# Patient Record
Sex: Female | Born: 1990 | Race: Black or African American | Hispanic: No | Marital: Single | Smoking: Never smoker
Health system: Southern US, Community
[De-identification: ages and names within clinical notes are randomized; demographics above are authoritative.]

## PROBLEM LIST (undated history)

## (undated) DIAGNOSIS — Z789 Other specified health status: Secondary | ICD-10-CM

## (undated) HISTORY — PX: NO PAST SURGERIES: SHX2092

---

## 2016-07-17 ENCOUNTER — Encounter: Payer: Self-pay | Admitting: Emergency Medicine

## 2016-07-17 ENCOUNTER — Emergency Department: Payer: Medicaid Other

## 2016-07-17 ENCOUNTER — Emergency Department
Admission: EM | Admit: 2016-07-17 | Discharge: 2016-07-17 | Disposition: A | Discharge status: Home / Self Care | Payer: Medicaid Other | Attending: Emergency Medicine | Admitting: Emergency Medicine

## 2016-07-17 DIAGNOSIS — R102 Pelvic and perineal pain: Secondary | ICD-10-CM | POA: Diagnosis present

## 2016-07-17 DIAGNOSIS — N809 Endometriosis, unspecified: Secondary | ICD-10-CM | POA: Insufficient documentation

## 2016-07-17 DIAGNOSIS — R1030 Lower abdominal pain, unspecified: Secondary | ICD-10-CM

## 2016-07-17 DIAGNOSIS — N80129 Deep endometriosis of ovary, unspecified ovary: Secondary | ICD-10-CM

## 2016-07-17 LAB — CHLAMYDIA/NGC RT PCR (ARMC ONLY)
Chlamydia Tr: NOT DETECTED
N GONORRHOEAE: NOT DETECTED

## 2016-07-17 LAB — COMPREHENSIVE METABOLIC PANEL
ALBUMIN: 4.3 g/dL (ref 3.5–5.0)
ALT: 8 U/L — ABNORMAL LOW (ref 14–54)
ANION GAP: 10 (ref 5–15)
AST: 15 U/L (ref 15–41)
Alkaline Phosphatase: 39 U/L (ref 38–126)
CO2: 27 mmol/L (ref 22–32)
Calcium: 9.5 mg/dL (ref 8.9–10.3)
Chloride: 101 mmol/L (ref 101–111)
Creatinine, Ser: 0.41 mg/dL — ABNORMAL LOW (ref 0.44–1.00)
GFR calc Af Amer: >60 mL/min (ref >60)
GFR calc non Af Amer: >60 mL/min (ref >60)
GLUCOSE: 85 mg/dL (ref 65–99)
POTASSIUM: 3 mmol/L — AB (ref 3.5–5.1)
SODIUM: 138 mmol/L (ref 135–145)
Total Bilirubin: 0.4 mg/dL (ref 0.3–1.2)
Total Protein: 8.1 g/dL (ref 6.5–8.1)

## 2016-07-17 LAB — URINALYSIS, COMPLETE (UACMP) WITH MICROSCOPIC
BACTERIA UA: NONE SEEN
Bilirubin Urine: NEGATIVE
Glucose, UA: NEGATIVE mg/dL
Hgb urine dipstick: NEGATIVE
Ketones, ur: 20 mg/dL — AB
NITRITE: NEGATIVE
PROTEIN: NEGATIVE mg/dL
Specific Gravity, Urine: 1.013 (ref 1.005–1.030)
pH: 6 (ref 5.0–8.0)

## 2016-07-17 LAB — WET PREP, GENITAL
Clue Cells Wet Prep HPF POC: NONE SEEN
Sperm: NONE SEEN
Trich, Wet Prep: NONE SEEN
Yeast Wet Prep HPF POC: NONE SEEN

## 2016-07-17 LAB — CBC
HEMATOCRIT: 33.2 % — AB (ref 35.0–47.0)
HEMOGLOBIN: 11.2 g/dL — AB (ref 12.0–16.0)
MCH: 27.5 pg (ref 26.0–34.0)
MCHC: 33.6 g/dL (ref 32.0–36.0)
MCV: 81.7 fL (ref 80.0–100.0)
Platelets: 426 10*3/uL (ref 150–440)
RBC: 4.06 MIL/uL (ref 3.80–5.20)
RDW: 13.5 % (ref 11.5–14.5)
WBC: 4.8 10*3/uL (ref 3.6–11.0)

## 2016-07-17 LAB — POCT PREGNANCY, URINE: PREG TEST UR: NEGATIVE

## 2016-07-17 LAB — LIPASE, BLOOD: Lipase: 10 U/L — ABNORMAL LOW (ref 11–51)

## 2016-07-17 MED ORDER — FENTANYL CITRATE (PF) 100 MCG/2ML IJ SOLN
50.0000 ug | Freq: Once | INTRAMUSCULAR | Status: AC
Start: 2016-07-17 — End: 2016-07-17
  Administered 2016-07-17: 50 ug via INTRAVENOUS
  Filled 2016-07-17: qty 1

## 2016-07-17 MED ORDER — IBUPROFEN 200 MG PO TABS: 600.0000 mg | ORAL_TABLET | Freq: Four times a day (QID) | ORAL | 0 refills | Status: AC | PRN

## 2016-07-17 MED ORDER — FENTANYL CITRATE (PF) 100 MCG/2ML IJ SOLN
INTRAMUSCULAR | Status: AC
  Filled 2016-07-17: qty 2

## 2016-07-17 MED ORDER — MORPHINE SULFATE (PF) 4 MG/ML IV SOLN: 4.0000 mg | Freq: Once | INTRAVENOUS | Status: DC

## 2016-07-17 MED ORDER — KETOROLAC TROMETHAMINE 30 MG/ML IJ SOLN
30.0000 mg | Freq: Once | INTRAMUSCULAR | Status: AC
  Administered 2016-07-17: 30 mg via INTRAVENOUS
  Filled 2016-07-17: qty 1

## 2016-07-17 MED ORDER — SODIUM CHLORIDE 0.9 % IV BOLUS (SEPSIS)
1000.0000 mL | Freq: Once | INTRAVENOUS | Status: AC
  Administered 2016-07-17: 1000 mL via INTRAVENOUS

## 2016-07-17 NOTE — ED Triage Notes (Signed)
Pt reports LLQ abdominal pain that began yesterday. Pt denies NVD. Denies fever. Pt denies dysuria.

## 2016-07-17 NOTE — ED Provider Notes (Addendum)
[facility] Emergency Department Provider Note  ____________________________________________   I have reviewed the triage vital signs and the nursing notes.   HISTORY  Chief Complaint Abdominal Pain    HPI Cassandra Crawford is a 26 y.o. female who presents complaining today of lower pelvic discomfort. Patient apparently has the same pelvic pain every month shortly after her menstrual period, it waxes and wanes in intensity. Usually eats a few days after the end of her menstrual period. Her normal period ended about 5 days ago she thanks. Today she had a gradual onset of the same cramping lower pelvic pain that she always has. She has not followed up with OB for this in the past because she states that she just got her Medicare sorted out. She denies any vomiting or fever she denies any numbness or weakness she denies any vaginal discharge or STI exposure she denies any change in bowel or bladder, nothing makes the pain worst of it makes the pain better and is cramping some purpura discomfort.    History reviewed. No pertinent past medical history.  There are no active problems to display for this patient.   No past surgical history on file.  Prior to Admission medications   Not on File    Allergies Patient has no known allergies.  No family history on file.  Social History Social History  Substance Use Topics  . Smoking status: Not on file  . Smokeless tobacco: Not on file  . Alcohol use Not on file    Review of Systems Constitutional: No fever/chills Eyes: No visual changes. ENT: No sore throat. No stiff neck no neck pain Cardiovascular: Denies chest pain. Respiratory: Denies shortness of breath. Gastrointestinal:   no vomiting.  No diarrhea.  No constipation. Genitourinary: Negative for dysuria. Musculoskeletal: Negative lower extremity swelling Skin: Negative for rash. Neurological: Negative for severe headaches, focal weakness or  numbness. 10-point ROS otherwise negative.  ____________________________________________   PHYSICAL EXAM:  VITAL SIGNS: ED Triage Vitals [07/17/16 1553]  Enc Vitals Group     BP 111/89     Pulse Rate 97     Resp 18     Temp 98.6 F (37 C)     Temp Source Oral     SpO2 96 %     Weight 115 lb (52.2 kg)     Height 5' (1.524 m)     Head Circumference      Peak Flow      Pain Score 10     Pain Loc      Pain Edu?      Excl. in GC?     Constitutional: Alert and oriented. Well appearing and in Nontoxic but uncomfortable Eyes: Conjunctivae are normal. PERRL. EOMI. Head: Atraumatic. Nose: No congestion/rhinnorhea. Mouth/Throat: Mucous membranes are moist.  Oropharynx non-erythematous. Neck: No stridor.   Nontender with no meningismus Cardiovascular: Normal rate, regular rhythm. Grossly normal heart sounds.  Good peripheral circulation. Respiratory: Normal respiratory effort.  No retractions. Lungs CTAB. Abdominal: Soft and nontender. No distention. No guarding no rebound Back:  There is no focal tenderness or step off.  there is no midline tenderness there are no lesions noted. there is no CVA tenderness Pelvic exam: Female nurse chaperone present, no external lesions noted, physiologic vaginal discharge noted with no purulent discharge, no cervical motion tenderness, slight left adnexal tenderness and no mass, there is no significant uterine tenderness or mass. No vaginal bleeding Musculoskeletal: No lower extremity tenderness, no upper extremity tenderness.  No joint effusions, no DVT signs strong distal pulses no edema Neurologic:  Normal speech and language. No gross focal neurologic deficits are appreciated.  Skin:  Skin is warm, dry and intact. No rash noted. Psychiatric: Mood and affect are normal. Speech and behavior are normal.  ____________________________________________   LABS (all labs ordered are listed, but only abnormal results are displayed)  Labs Reviewed   LIPASE, BLOOD - Abnormal; Notable for the following:       Result Value   Lipase 10 (*)    All other components within normal limits  COMPREHENSIVE METABOLIC PANEL - Abnormal; Notable for the following:    Potassium 3.0 (*)    BUN <5 (*)    Creatinine, Ser 0.41 (*)    ALT 8 (*)    All other components within normal limits  CBC - Abnormal; Notable for the following:    Hemoglobin 11.2 (*)    HCT 33.2 (*)    All other components within normal limits  URINALYSIS, COMPLETE (UACMP) WITH MICROSCOPIC - Abnormal; Notable for the following:    Color, Urine YELLOW (*)    APPearance CLEAR (*)    Ketones, ur 20 (*)    Leukocytes, UA TRACE (*)    Squamous Epithelial / LPF 0-5 (*)    All other components within normal limits  CHLAMYDIA/NGC RT PCR ([facility] ONLY)  WET PREP, GENITAL  POC URINE PREG, ED  POCT PREGNANCY, URINE   ____________________________________________  EKG  I personally interpreted any EKGs ordered by me or triage  ____________________________________________  RADIOLOGY  I reviewed any imaging ordered by me or triage that were performed during my shift and, if possible, patient and/or family made aware of any abnormal findings. ____________________________________________   PROCEDURES  Procedure(s) performed: None  Procedures  Critical Care performed: None  ____________________________________________   INITIAL IMPRESSION / ASSESSMENT AND PLAN / ED COURSE  Pertinent labs & imaging results that were available during my care of the patient were reviewed by me and considered in my medical decision making (see chart for details).  She was severe recurrent abdominal pain. She is quite uncomfortable when I first saw her, however after pain medications now she is nearly pain-free and much more relaxed. Her workup thus far as reassuring. Patient has had recurrent pelvic discomfort. Could be mittelschmerz or other such cyclic OB/GYN related discomfort.  Ovarian cyst or torsion is possible to given chronic recurrent nature of the pain I have low suspicion for that. Very low suspicion for appendicitis. Patient has a completely benign exam with minimal left adnexal discomfort. We will obtain a ultrasound. There is no evidence on exam of TOA or PID. If she remains well-appearing with a reassuring exam and she'll be safe for outpatient OB follow-up for this chronic recurrent symptom.  ----------------------------------------- 10:19 PM on 07/17/2016 -----------------------------------------  Discussed with Dr. Chauncey CruelStabler, patient has likely endometriomas, we have no evidence of torsion. He did look at the ultrasound agrees with management and discharge will follow closely as an outpatient patient is pain-free at this time. Extensive return precautions and follow-up instructions given. Patient was sent she must follow-up because we are not 100% sure that these are endometriomas and she needs to be fully evaluated to ensure that it is not oncology process  Clinical Course    ____________________________________________   FINAL CLINICAL IMPRESSION(S) / ED DIAGNOSES  Final diagnoses:  Pelvic pain      This chart was dictated using voice recognition software.  Despite best efforts to proofread,  errors can occur which can change meaning.      Jeanmarie Plant, MD 07/17/16 2956    Jeanmarie Plant, MD 07/17/16 2219

## 2016-07-30 ENCOUNTER — Encounter
Admission: RE | Admit: 2016-07-30 | Discharge: 2016-07-30 | Disposition: A | Discharge status: Home / Self Care | Payer: Medicaid Other | Source: Ambulatory Visit | Attending: Obstetrics & Gynecology | Admitting: Obstetrics & Gynecology

## 2016-07-30 NOTE — Patient Instructions (Signed)
  Your procedure is scheduled on: August 05, 2016 (Tuesday) Report to Same Day Surgery 2nd floor medical mall [facility](Medical Mall Entrance-take elevator on left to 2nd floor.  Check in with surgery information desk.) To find out your arrival time please call [phone] between 1PM - 3PM on August 04, 2016 (Monday)  Remember: Instructions that are not followed completely may result in serious medical risk, up to and including death, or upon the discretion of your surgeon and anesthesiologist your surgery may need to be rescheduled.    _x___ 1. Do not eat food or drink liquids after midnight. No gum chewing or hard candies.     __x__ 2. No Alcohol for 24 hours before or after surgery.   __x__3. No Smoking for 24 prior to surgery.   ____  4. Bring all medications with you on the day of surgery if instructed.    __x__ 5. Notify your doctor if there is any change in your medical condition     (cold, fever, infections).     Do not wear jewelry, make-up, hairpins, clips or nail polish.  Do not wear lotions, powders, or perfumes. You may wear deodorant.  Do not shave 48 hours prior to surgery. Men may shave face and neck.  Do not bring valuables to the hospital.    [facility] is not responsible for any belongings or valuables.               Contacts, dentures or bridgework may not be worn into surgery.  Leave your suitcase in the car. After surgery it may be brought to your room.  For patients admitted to the hospital, discharge time is determined by your treatment team.   Patients discharged the day of surgery will not be allowed to drive home.  You will need someone to drive you home and stay with you the night of your procedure.    Please read over the following fact sheets that you were given:   [facility] Preparing for Surgery and or MRSA Information   __ Take these medicines the morning of surgery with A SIP OF WATER:    1.   2.  3.  4.  5.  6.  ____Fleets enema or Magnesium  Citrate as directed.   _x___ Use CHG Soap or sage wipes as directed on instruction sheet   ____ Use inhalers on the day of surgery and bring to hospital day of surgery  ____ Stop metformin 2 days prior to surgery    ____ Take 1/2 of usual insulin dose the night before surgery and none on the morning of           surgery.   __x__ Stop Aspirin, Coumadin, Pllavix ,Eliquis, Effient, or Pradaxa (NO ASPIRIN)  x__ Stop Anti-inflammatories such as Advil, Aleve, Ibuprofen, Motrin, Naproxen,          Naprosyn, Goodies powders or aspirin products. Ok to take Tylenol or pain medication.   ____ Stop supplements until after surgery.    ____ Bring C-Pap to the hospital.

## 2016-08-04 ENCOUNTER — Encounter
Admission: RE | Admit: 2016-08-04 | Discharge: 2016-08-04 | Disposition: A | Discharge status: Home / Self Care | Payer: Medicaid Other | Source: Ambulatory Visit | Attending: Obstetrics & Gynecology | Admitting: Obstetrics & Gynecology

## 2016-08-04 DIAGNOSIS — Z01812 Encounter for preprocedural laboratory examination: Secondary | ICD-10-CM | POA: Diagnosis present

## 2016-08-04 LAB — TYPE AND SCREEN
ABO/RH(D): A POS
ANTIBODY SCREEN: NEGATIVE

## 2016-08-04 LAB — CBC
HCT: 35.1 % (ref 35.0–47.0)
HEMOGLOBIN: 11.8 g/dL — AB (ref 12.0–16.0)
MCH: 27.5 pg (ref 26.0–34.0)
MCHC: 33.6 g/dL (ref 32.0–36.0)
MCV: 81.8 fL (ref 80.0–100.0)
PLATELETS: 343 10*3/uL (ref 150–440)
RBC: 4.29 MIL/uL (ref 3.80–5.20)
RDW: 13.8 % (ref 11.5–14.5)
WBC: 5.6 10*3/uL (ref 3.6–11.0)

## 2016-08-05 ENCOUNTER — Encounter
Admission: RE | Disposition: A | Discharge status: Home / Self Care | Payer: Self-pay | Source: Ambulatory Visit | Attending: Obstetrics & Gynecology

## 2016-08-05 ENCOUNTER — Ambulatory Visit
Admission: RE | Admit: 2016-08-05 | Discharge: 2016-08-05 | Disposition: A | Discharge status: Home / Self Care | Payer: Medicaid Other | Source: Ambulatory Visit | Attending: Obstetrics & Gynecology | Admitting: Obstetrics & Gynecology

## 2016-08-05 ENCOUNTER — Ambulatory Visit: Payer: Medicaid Other | Admitting: Registered Nurse

## 2016-08-05 ENCOUNTER — Encounter: Payer: Self-pay | Admitting: *Deleted

## 2016-08-05 DIAGNOSIS — N83202 Unspecified ovarian cyst, left side: Secondary | ICD-10-CM | POA: Insufficient documentation

## 2016-08-05 DIAGNOSIS — N736 Female pelvic peritoneal adhesions (postinfective): Secondary | ICD-10-CM | POA: Insufficient documentation

## 2016-08-05 DIAGNOSIS — N83201 Unspecified ovarian cyst, right side: Secondary | ICD-10-CM | POA: Diagnosis present

## 2016-08-05 HISTORY — PX: LAPAROSCOPY: SHX197

## 2016-08-05 HISTORY — DX: Other specified health status: Z78.9

## 2016-08-05 HISTORY — PX: LAPAROSCOPIC OVARIAN CYSTECTOMY: SHX6248

## 2016-08-05 LAB — POCT PREGNANCY, URINE: Preg Test, Ur: NEGATIVE

## 2016-08-05 LAB — ABO/RH: ABO/RH(D): A POS

## 2016-08-05 SURGERY — LAPAROSCOPY OPERATIVE
Anesthesia: General | Laterality: Right | Wound class: Clean Contaminated

## 2016-08-05 MED ORDER — ACETAMINOPHEN 650 MG RE SUPP
650.0000 mg | RECTAL | Status: DC | PRN
  Filled 2016-08-05: qty 1

## 2016-08-05 MED ORDER — MIDAZOLAM HCL 2 MG/2ML IJ SOLN
INTRAMUSCULAR | Status: DC | PRN
  Administered 2016-08-05: 2 mg via INTRAVENOUS

## 2016-08-05 MED ORDER — PHENYLEPHRINE HCL 10 MG/ML IJ SOLN
INTRAMUSCULAR | Status: DC | PRN
  Administered 2016-08-05: 100 ug via INTRAVENOUS

## 2016-08-05 MED ORDER — OXYCODONE-ACETAMINOPHEN 5-325 MG PO TABS
ORAL_TABLET | ORAL | Status: AC
  Administered 2016-08-05: 1 via ORAL
  Filled 2016-08-05: qty 1

## 2016-08-05 MED ORDER — LIDOCAINE HCL (PF) 2 % IJ SOLN
INTRAMUSCULAR | Status: AC
  Filled 2016-08-05: qty 2

## 2016-08-05 MED ORDER — FAMOTIDINE 20 MG PO TABS
ORAL_TABLET | ORAL | Status: AC
  Administered 2016-08-05: 20 mg via ORAL
  Filled 2016-08-05: qty 1

## 2016-08-05 MED ORDER — OXYCODONE-ACETAMINOPHEN 5-325 MG PO TABS: 1.0000 | ORAL_TABLET | ORAL | 0 refills | Status: AC | PRN

## 2016-08-05 MED ORDER — DEXAMETHASONE SODIUM PHOSPHATE 10 MG/ML IJ SOLN
INTRAMUSCULAR | Status: DC | PRN
  Administered 2016-08-05: 10 mg via INTRAVENOUS

## 2016-08-05 MED ORDER — KETOROLAC TROMETHAMINE 30 MG/ML IJ SOLN
30.0000 mg | Freq: Four times a day (QID) | INTRAMUSCULAR | Status: DC
  Administered 2016-08-05: 30 mg via INTRAVENOUS
  Filled 2016-08-05: qty 1

## 2016-08-05 MED ORDER — FENTANYL CITRATE (PF) 100 MCG/2ML IJ SOLN
INTRAMUSCULAR | Status: AC
  Filled 2016-08-05: qty 2

## 2016-08-05 MED ORDER — FENTANYL CITRATE (PF) 100 MCG/2ML IJ SOLN
INTRAMUSCULAR | Status: AC
  Administered 2016-08-05: 25 ug via INTRAVENOUS
  Filled 2016-08-05: qty 2

## 2016-08-05 MED ORDER — BUPIVACAINE HCL (PF) 0.5 % IJ SOLN
INTRAMUSCULAR | Status: AC
  Filled 2016-08-05: qty 20

## 2016-08-05 MED ORDER — ONDANSETRON HCL 4 MG/2ML IJ SOLN: 4.0000 mg | Freq: Once | INTRAMUSCULAR | Status: DC | PRN

## 2016-08-05 MED ORDER — PROPOFOL 10 MG/ML IV BOLUS
INTRAVENOUS | Status: DC | PRN
  Administered 2016-08-05: 130 mg via INTRAVENOUS

## 2016-08-05 MED ORDER — MORPHINE SULFATE (PF) 4 MG/ML IV SOLN: 1.0000 mg | INTRAVENOUS | Status: DC | PRN

## 2016-08-05 MED ORDER — MIDAZOLAM HCL 2 MG/2ML IJ SOLN
INTRAMUSCULAR | Status: AC
  Filled 2016-08-05: qty 2

## 2016-08-05 MED ORDER — KETOROLAC TROMETHAMINE 30 MG/ML IJ SOLN
INTRAMUSCULAR | Status: AC
  Administered 2016-08-05: 30 mg via INTRAVENOUS
  Filled 2016-08-05: qty 1

## 2016-08-05 MED ORDER — ACETAMINOPHEN 325 MG PO TABS: 650.0000 mg | ORAL_TABLET | ORAL | Status: DC | PRN

## 2016-08-05 MED ORDER — HYDROMORPHONE HCL 1 MG/ML IJ SOLN
0.2500 mg | INTRAMUSCULAR | Status: DC | PRN
  Administered 2016-08-05 (×4): 0.25 mg via INTRAVENOUS

## 2016-08-05 MED ORDER — ROCURONIUM BROMIDE 100 MG/10ML IV SOLN
INTRAVENOUS | Status: DC | PRN
  Administered 2016-08-05: 40 mg via INTRAVENOUS

## 2016-08-05 MED ORDER — FENTANYL CITRATE (PF) 100 MCG/2ML IJ SOLN
INTRAMUSCULAR | Status: DC | PRN
  Administered 2016-08-05: 100 ug via INTRAVENOUS
  Administered 2016-08-05: 25 ug via INTRAVENOUS

## 2016-08-05 MED ORDER — LIDOCAINE HCL (CARDIAC) 20 MG/ML IV SOLN
INTRAVENOUS | Status: DC | PRN
  Administered 2016-08-05: 80 mg via INTRAVENOUS

## 2016-08-05 MED ORDER — SUGAMMADEX SODIUM 200 MG/2ML IV SOLN
INTRAVENOUS | Status: DC | PRN
  Administered 2016-08-05: 200 mg via INTRAVENOUS

## 2016-08-05 MED ORDER — ONDANSETRON HCL 4 MG/2ML IJ SOLN
INTRAMUSCULAR | Status: AC
  Filled 2016-08-05: qty 2

## 2016-08-05 MED ORDER — FENTANYL CITRATE (PF) 100 MCG/2ML IJ SOLN
25.0000 ug | INTRAMUSCULAR | Status: DC | PRN
  Administered 2016-08-05 (×4): 25 ug via INTRAVENOUS

## 2016-08-05 MED ORDER — BUPIVACAINE HCL (PF) 0.5 % IJ SOLN
INTRAMUSCULAR | Status: DC | PRN
  Administered 2016-08-05: 10 mL

## 2016-08-05 MED ORDER — ROCURONIUM BROMIDE 50 MG/5ML IV SOSY
PREFILLED_SYRINGE | INTRAVENOUS | Status: AC
  Filled 2016-08-05: qty 5

## 2016-08-05 MED ORDER — LACTATED RINGERS IV SOLN
INTRAVENOUS | Status: DC
  Administered 2016-08-05: 13:00:00 via INTRAVENOUS

## 2016-08-05 MED ORDER — DEXAMETHASONE SODIUM PHOSPHATE 10 MG/ML IJ SOLN
INTRAMUSCULAR | Status: AC
  Filled 2016-08-05: qty 1

## 2016-08-05 MED ORDER — HYDROMORPHONE HCL 1 MG/ML IJ SOLN
INTRAMUSCULAR | Status: AC
  Administered 2016-08-05: 0.25 mg via INTRAVENOUS
  Filled 2016-08-05: qty 1

## 2016-08-05 MED ORDER — PROPOFOL 10 MG/ML IV BOLUS
INTRAVENOUS | Status: AC
  Filled 2016-08-05: qty 20

## 2016-08-05 MED ORDER — FAMOTIDINE 20 MG PO TABS
20.0000 mg | ORAL_TABLET | Freq: Once | ORAL | Status: AC
  Administered 2016-08-05: 20 mg via ORAL

## 2016-08-05 MED ORDER — ONDANSETRON HCL 4 MG/2ML IJ SOLN
INTRAMUSCULAR | Status: DC | PRN
  Administered 2016-08-05: 4 mg via INTRAVENOUS

## 2016-08-05 MED ORDER — OXYCODONE-ACETAMINOPHEN 5-325 MG PO TABS
1.0000 | ORAL_TABLET | ORAL | Status: DC | PRN
  Administered 2016-08-05: 1 via ORAL

## 2016-08-05 SURGICAL SUPPLY — 36 items
BLADE SURG SZ11 CARB STEEL (BLADE) ×4 IMPLANT
CANISTER SUCT 1200ML W/VALVE (MISCELLANEOUS) ×4 IMPLANT
CATH ROBINSON RED A/P 16FR (CATHETERS) ×4 IMPLANT
CHLORAPREP W/TINT 26ML (MISCELLANEOUS) ×4 IMPLANT
DEFOGGER SCOPE WARMER CLEARIFY (MISCELLANEOUS) ×8 IMPLANT
DRESSING TELFA 4X3 1S ST N-ADH (GAUZE/BANDAGES/DRESSINGS) IMPLANT
ENDOPOUCH RETRIEVER 10 (MISCELLANEOUS) IMPLANT
GAUZE SPONGE NON-WVN 2X2 STRL (MISCELLANEOUS) IMPLANT
GLOVE BIO SURGEON STRL SZ8 (GLOVE) ×16 IMPLANT
GLOVE INDICATOR 8.0 STRL GRN (GLOVE) ×12 IMPLANT
GOWN STRL REUS W/ TWL LRG LVL3 (GOWN DISPOSABLE) ×4 IMPLANT
GOWN STRL REUS W/ TWL XL LVL3 (GOWN DISPOSABLE) ×2 IMPLANT
GOWN STRL REUS W/TWL LRG LVL3 (GOWN DISPOSABLE) ×4
GOWN STRL REUS W/TWL XL LVL3 (GOWN DISPOSABLE) ×2
IRRIGATION STRYKERFLOW (MISCELLANEOUS) ×2 IMPLANT
IRRIGATOR STRYKERFLOW (MISCELLANEOUS) ×4
IV LACTATED RINGERS 1000ML (IV SOLUTION) IMPLANT
KIT PINK PAD W/HEAD ARE REST (MISCELLANEOUS) ×4
KIT PINK PAD W/HEAD ARM REST (MISCELLANEOUS) ×2 IMPLANT
LABEL OR SOLS (LABEL) IMPLANT
LIQUID BAND (GAUZE/BANDAGES/DRESSINGS) ×4 IMPLANT
NEEDLE VERESS 14GA 120MM (NEEDLE) ×4 IMPLANT
NS IRRIG 500ML POUR BTL (IV SOLUTION) ×4 IMPLANT
PACK GYN LAPAROSCOPIC (MISCELLANEOUS) ×4 IMPLANT
PAD PREP 24X41 OB/GYN DISP (PERSONAL CARE ITEMS) ×4 IMPLANT
SCISSORS METZENBAUM CVD 33 (INSTRUMENTS) IMPLANT
SHEARS HARMONIC ACE PLUS 36CM (ENDOMECHANICALS) ×4 IMPLANT
SLEEVE ENDOPATH XCEL 5M (ENDOMECHANICALS) IMPLANT
SPONGE VERSALON 2X2 STRL (MISCELLANEOUS)
STRAP SAFETY BODY (MISCELLANEOUS) ×4 IMPLANT
SUT VIC AB 2-0 UR6 27 (SUTURE) IMPLANT
SUT VIC AB 4-0 PS2 18 (SUTURE) IMPLANT
SYRINGE 10CC LL (SYRINGE) ×4 IMPLANT
TROCAR ENDO BLADELESS 11MM (ENDOMECHANICALS) ×4 IMPLANT
TROCAR XCEL NON-BLD 5MMX100MML (ENDOMECHANICALS) ×4 IMPLANT
TUBING INSUFFLATOR HI FLOW (MISCELLANEOUS) ×4 IMPLANT

## 2016-08-05 NOTE — H&P (Signed)
History and Physical Interval Note:  08/05/2016 1:23 PM  Cassandra Crawford  has presented today for surgery, with the diagnosis of ovarian cyst, bilateral,endometriosis,llq pain  The various methods of treatment have been discussed with the patient and family. After consideration of risks, benefits and other options for treatment, the patient has consented to  Procedure(s): LAPAROSCOPY OPERATIVE EXCISION OF ENDOMETRIOSIS (N/A) LAPAROSCOPIC OVARIAN CYSTECTOMY (Bilateral) as a surgical intervention .  The patient's history has been reviewed, patient examined, no change in status, stable for surgery.  Pt has the following beta blocker history-  Not taking Beta Blocker.  I have reviewed the patient's chart and labs.  Questions were answered to the patient's satisfaction.    Letitia Libraobert Paul Kimori Tartaglia

## 2016-08-05 NOTE — Transfer of Care (Signed)
Immediate Anesthesia Transfer of Care Note  Patient: Cassandra PerchesJeavon Crawford  Procedure(s) Performed: Procedure(s): DIAGNOSTIC LAPAROSCOPY (N/A) LAPAROSCOPIC OVARIAN CYSTECTOMY (Right)  Patient Location: PACU  Anesthesia Type:General  Level of Consciousness: patient cooperative and lethargic  Airway & Oxygen Therapy: Patient Spontanous Breathing and Patient connected to face mask oxygen  Post-op Assessment: Report given to RN and Post -op Vital signs reviewed and stable  Post vital signs: Reviewed  Last Vitals:  Vitals:   08/05/16 1243 08/05/16 1507  BP: 111/67 120/72  Pulse: 89 97  Resp: 18 17  Temp: 36.9 C 36.3 C    Last Pain:  Vitals:   08/05/16 1243  TempSrc: Oral  PainSc: 5          Complications: No apparent anesthesia complications

## 2016-08-05 NOTE — Anesthesia Preprocedure Evaluation (Signed)
Anesthesia Evaluation  Patient identified by MRN, date of birth, ID band Patient awake    Reviewed: Allergy & Precautions, H&P , NPO status , Patient's Chart, lab work & pertinent test results, reviewed documented beta blocker date and time   Airway Mallampati: II  TM Distance: >3 FB Neck ROM: full    Dental  (+) Teeth Intact   Pulmonary neg pulmonary ROS,    Pulmonary exam normal        Cardiovascular negative cardio ROS Normal cardiovascular exam Rhythm:regular Rate:Normal     Neuro/Psych negative neurological ROS  negative psych ROS   GI/Hepatic negative GI ROS, Neg liver ROS,   Endo/Other  negative endocrine ROS  Renal/GU negative Renal ROS  negative genitourinary   Musculoskeletal   Abdominal   Peds  Hematology negative hematology ROS (+)   Anesthesia Other Findings Past Medical History: No date: Medical history non-contributory Past Surgical History: No date: NO PAST SURGERIES BMI    Body Mass Index:  20.51 kg/m     Reproductive/Obstetrics negative OB ROS                             Anesthesia Physical Anesthesia Plan  ASA: II  Anesthesia Plan: General ETT   Post-op Pain Management:    Induction:   Airway Management Planned:   Additional Equipment:   Intra-op Plan:   Post-operative Plan:   Informed Consent: I have reviewed the patients History and Physical, chart, labs and discussed the procedure including the risks, benefits and alternatives for the proposed anesthesia with the patient or authorized representative who has indicated his/her understanding and acceptance.   Dental Advisory Given  Plan Discussed with: CRNA  Anesthesia Plan Comments:         Anesthesia Quick Evaluation

## 2016-08-05 NOTE — Anesthesia Procedure Notes (Signed)
Procedure Name: Intubation Date/Time: 08/05/2016 1:57 PM Performed by: Doreen Salvage Pre-anesthesia Checklist: Patient identified, Patient being monitored, Timeout performed, Emergency Drugs available and Suction available Patient Re-evaluated:Patient Re-evaluated prior to inductionOxygen Delivery Method: Circle system utilized Preoxygenation: Pre-oxygenation with 100% oxygen Intubation Type: IV induction Ventilation: Mask ventilation without difficulty Laryngoscope Size: Mac and 3 Grade View: Grade I Tube type: Oral Tube size: 7.0 mm Number of attempts: 1 Airway Equipment and Method: Stylet Placement Confirmation: ETT inserted through vocal cords under direct vision,  positive ETCO2 and breath sounds checked- equal and bilateral Secured at: 21 cm Tube secured with: Tape Dental Injury: Teeth and Oropharynx as per pre-operative assessment

## 2016-08-05 NOTE — Anesthesia Post-op Follow-up Note (Cosign Needed)
Anesthesia QCDR form completed.        

## 2016-08-05 NOTE — Op Note (Signed)
  Operative Note   08/05/2016  PRE-OP DIAGNOSIS: Bilateral Ovarian Cyst, Pelvic Pain   POST-OP DIAGNOSIS: same   PROCEDURE: Procedure(s): DIAGNOSTIC LAPAROSCOPY RIGHT LAPAROSCOPIC OVARIAN CYST ASPIRATION   SURGEON: Annamarie MajorPaul Dicie Edelen, MD, FACOG  ANESTHESIA: Choice   ESTIMATED BLOOD LOSS: Min  COMPLICATIONS: None  DISPOSITION: PACU - hemodynamically stable.  CONDITION: stable  FINDINGS: Laparoscopic survey of the abdomen revealed a severely adherent colon to uterus and left adnexa to the point that could not visualize left ovary. Normal liver edge, enlarged gallbladder.Most intra-abdominal adhesions were noted in the left lower quadrant. RIGHT Ovarian Cyst noted with dark red/brown thick fluid aspirated.  PROCEDURE IN DETAIL: The patient was taken to the OR where anesthesia was administed. The patient was positioned in dorsal lithotomy in the [place] stirrups. The patient was then examined under anesthesia with the above noted findings. The patient was prepped and draped in the normal sterile fashion and foley catheter was placed. A sponge stick was placed vaginally for manipulation purposes.  Attention was turned to the patient's abdomen where a 5 mm skin incision was made in the umbilical fold, after injection of local anesthesia. The Veress step needle was carefully introduced into the peritoneal cavity with placement confirmed using the hanging drop technique.  Pneumoperitoneum was obtained. The 5 mm port was then placed under direct visualization with the operative laparoscope.  Trendelenburg positioning.  Additional 5mm trocar was then placed in the RLQ lateral to the inferior epigastric blood vessels under direct visualization with the laparoscope.  Instrumentation to visualize complete pelvic anatomy performed.  Adhesions noted and degree of risk and difficulty in dissecting colon off of uterus and left adnexa determined to be too high to attempt today.  A small band of omental adhesion is  transected using the harmonic scapel to release tension.  No further dissection done.  No electrocautery utilized.  The right ovarian cyst is identified and stabilized.  An incision is made just with manipulation of ovary with fluid noted as above.  Fluid is aspirated.   Pelvic cavity is cleaned with any fluid aspirated.  Instruments and trocars removed, gas expelled, and skin closed with skin adhesive glue.  Instrument, needle, and sponge counts correct x2 at the conclusion of the case.  Pt goes to recovery room in stable condition.

## 2016-08-05 NOTE — Discharge Instructions (Signed)
General Gynecological Post-Operative Instructions °You may expect to feel dizzy, weak, and drowsy for as long as 24 hours after receiving the medicine that made you sleep (anesthetic).  °Do not drive a car, ride a bicycle, participate in physical activities, or take public transportation until you are done taking narcotic pain medicines or as directed by your doctor.  °Do not drink alcohol or take tranquilizers.  °Do not take medicine that has not been prescribed by your doctor.  °Do not sign important papers or make important decisions while on narcotic pain medicines.  °Have a responsible person with you.  °CARE OF INCISION  °Keep incision clean and dry. °Take showers instead of baths until your doctor gives you permission to take baths.  °Avoid heavy lifting (more than 10 pounds/4.5 kilograms), pushing, or pulling.  °Avoid activities that may risk injury to your surgical site.  °No sexual intercourse or placement of anything in the vagina for 2 weeks or as instructed by your doctor. °If you have tubes coming from the wound site, check with your doctor regarding appropriate care of the tubes. °Only take prescription or over-the-counter medicines  for pain, discomfort, or fever as directed by your doctor. Do not take aspirin. It can make you bleed. Take medicines (antibiotics) that kill germs if they are prescribed for you.  °Call the office or go to the ER if:  °You feel sick to your stomach (nauseous) and you start to throw up (vomit).  °You have trouble eating or drinking.  °You have an oral temperature above 101.  °You have constipation that is not helped by adjusting diet or increasing fluid intake. Pain medicines are a common cause of constipation.  °You have any other concerns. °SEEK IMMEDIATE MEDICAL CARE IF:  °You have persistent dizziness.  °You have difficulty breathing or a congested sounding (croupy) cough.  °You have an oral temperature above 102.5, not controlled by medicine.  °There is increasing  pain or tenderness near or in the surgical site.  ° ° ° °

## 2016-08-06 ENCOUNTER — Encounter: Payer: Self-pay | Admitting: Obstetrics & Gynecology

## 2016-08-13 NOTE — Anesthesia Postprocedure Evaluation (Signed)
Anesthesia Post Note  Patient: Cassandra Crawford  Procedure(s) Performed: Procedure(s) (LRB): DIAGNOSTIC LAPAROSCOPY (N/A) LAPAROSCOPIC OVARIAN CYSTECTOMY (Right)  Patient location during evaluation: PACU Anesthesia Type: General Level of consciousness: awake and alert Pain management: pain level controlled Vital Signs Assessment: post-procedure vital signs reviewed and stable Respiratory status: spontaneous breathing, nonlabored ventilation, respiratory function stable and patient connected to nasal cannula oxygen Cardiovascular status: blood pressure returned to baseline and stable Postop Assessment: no signs of nausea or vomiting Anesthetic complications: no     Last Vitals:  Vitals:   08/05/16 1620 08/05/16 1709  BP: 91/64 104/66  Pulse: 86   Resp: 18 18  Temp: 36.4 C     Last Pain:  Vitals:   08/06/16 0857  TempSrc:   PainSc: 1                  Cassandra Crawford

## 2016-08-14 ENCOUNTER — Emergency Department
Admission: EM | Admit: 2016-08-14 | Discharge: 2016-08-14 | Disposition: A | Discharge status: Home / Self Care | Payer: Medicaid Other | Attending: Emergency Medicine | Admitting: Emergency Medicine

## 2016-08-14 ENCOUNTER — Encounter: Payer: Self-pay | Admitting: Emergency Medicine

## 2016-08-14 DIAGNOSIS — Z7982 Long term (current) use of aspirin: Secondary | ICD-10-CM | POA: Diagnosis not present

## 2016-08-14 DIAGNOSIS — R1032 Left lower quadrant pain: Secondary | ICD-10-CM | POA: Diagnosis not present

## 2016-08-14 DIAGNOSIS — Z791 Long term (current) use of non-steroidal anti-inflammatories (NSAID): Secondary | ICD-10-CM | POA: Insufficient documentation

## 2016-08-14 DIAGNOSIS — Z5321 Procedure and treatment not carried out due to patient leaving prior to being seen by health care provider: Secondary | ICD-10-CM | POA: Insufficient documentation

## 2016-08-14 NOTE — ED Notes (Signed)
See triage note   States she had lap done last week for ovarian cyst  States she is scheduled to see surgeon next Monday   having increased pain  ra out of pain meds

## 2016-08-14 NOTE — ED Notes (Signed)
Not in room for registration or provider exam

## 2016-08-14 NOTE — ED Triage Notes (Signed)
Pt to ED c/o LLQ pain.  States had ovarian cyst removed last Tuesday and ran out of percocet on Sunday.  States follow up appointment this coming Monday.

## 2016-10-01 ENCOUNTER — Telehealth: Payer: Self-pay

## 2016-10-01 NOTE — Telephone Encounter (Signed)
Appt

## 2016-10-01 NOTE — Telephone Encounter (Signed)
Pt had surgery two months ago and was healing well.  Now incision is swollen and puss is coming out of it.  She has kept it clean.  What to do.  [phone]

## 2016-10-02 ENCOUNTER — Ambulatory Visit: Payer: Self-pay | Admitting: Obstetrics & Gynecology

## 2016-10-02 NOTE — Telephone Encounter (Signed)
Pt is schedule with [name] 10/02/16

## 2016-10-02 NOTE — Telephone Encounter (Signed)
Can you call pt and schedule appt

## 2016-10-06 ENCOUNTER — Ambulatory Visit: Payer: Self-pay | Admitting: Obstetrics & Gynecology

## 2017-01-31 IMAGING — US US PELVIS COMPLETE
1 series · 13 of 25 positions shown · non-contrast
Comparison: None.

CLINICAL DATA: Left lower quadrant pain for 1 day.

EXAM:
TRANSABDOMINAL AND TRANSVAGINAL ULTRASOUND OF PELVIS
DOPPLER ULTRASOUND OF OVARIES
TECHNIQUE: Both transabdominal and transvaginal ultrasound examinations of the
pelvis were performed. Transabdominal technique was performed for
global imaging of the pelvis including uterus, ovaries, adnexal
regions, and pelvic cul-de-sac.
It was necessary to proceed with endovaginal exam following the
transabdominal exam to visualize the ovaries. Color and duplex
Doppler ultrasound was utilized to evaluate blood flow to the
ovaries.

[Series 1: us pelvis complete · 0.17mm/px · 125 acquisitions, 13 frames shown]
[im 1/125]
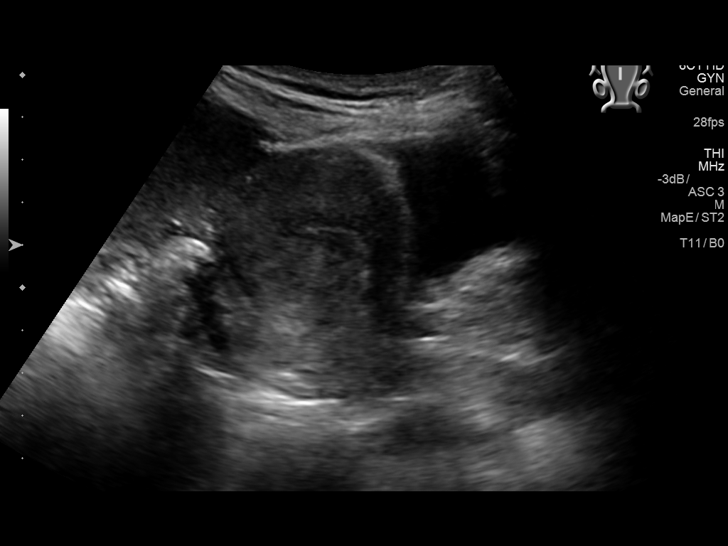
[im 11/125]
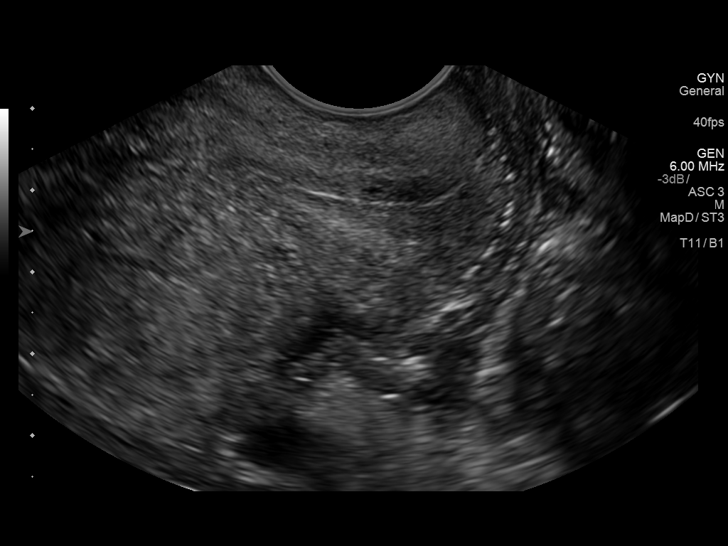
[im 21/125]
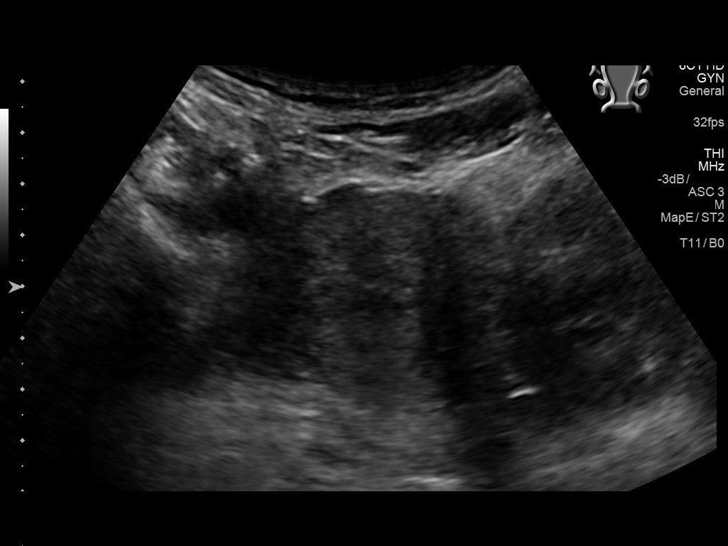
[im 32/125]
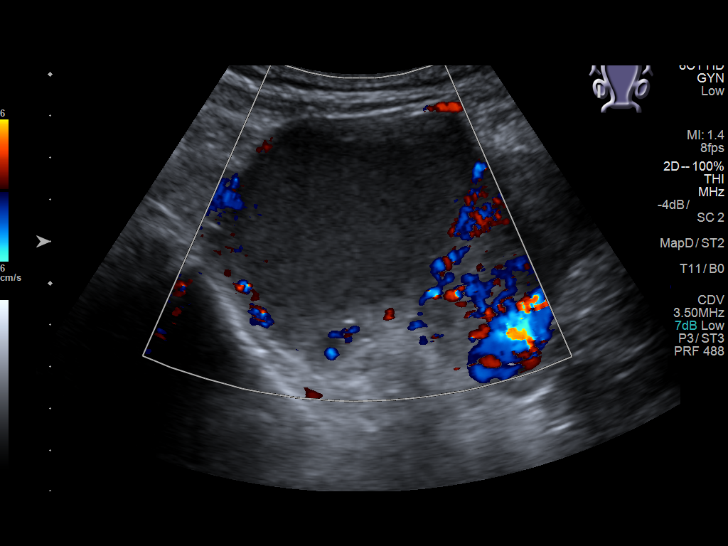
[im 42/125]
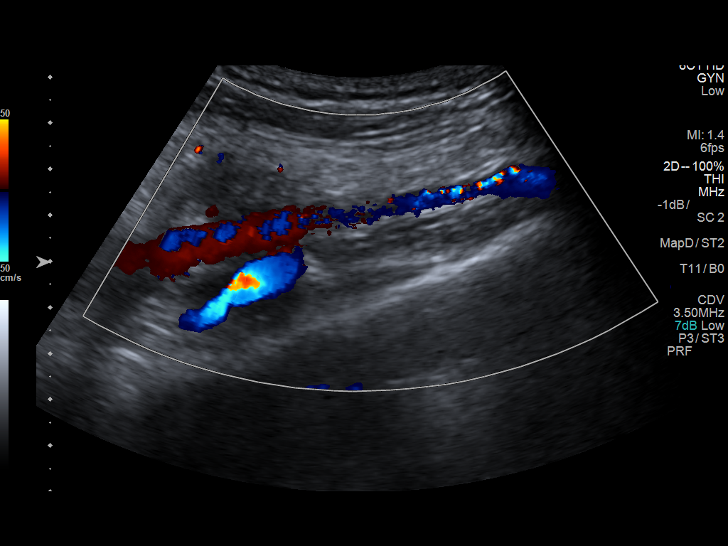
[im 52/125]
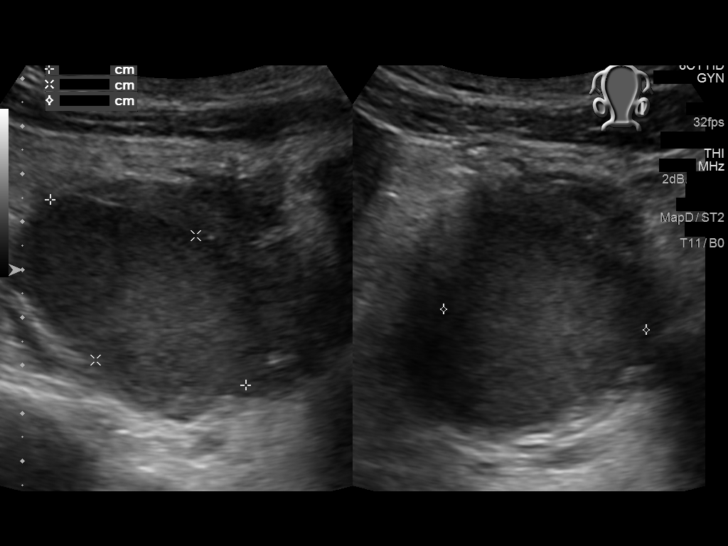
[im 63/125]
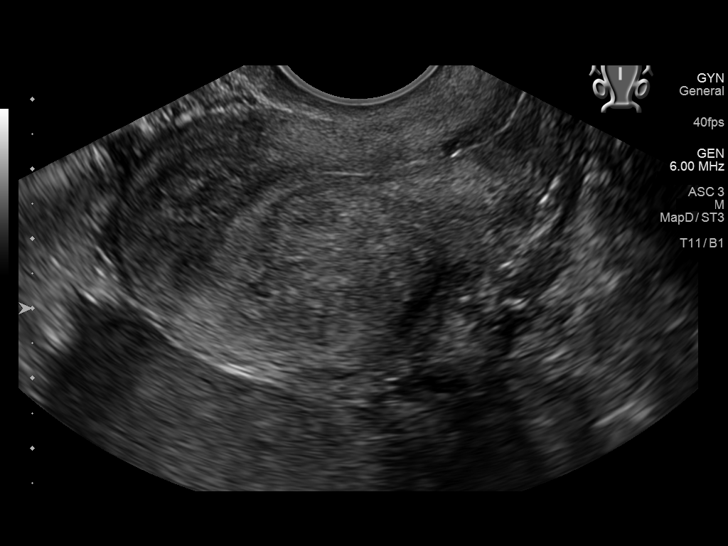
[im 73/125]
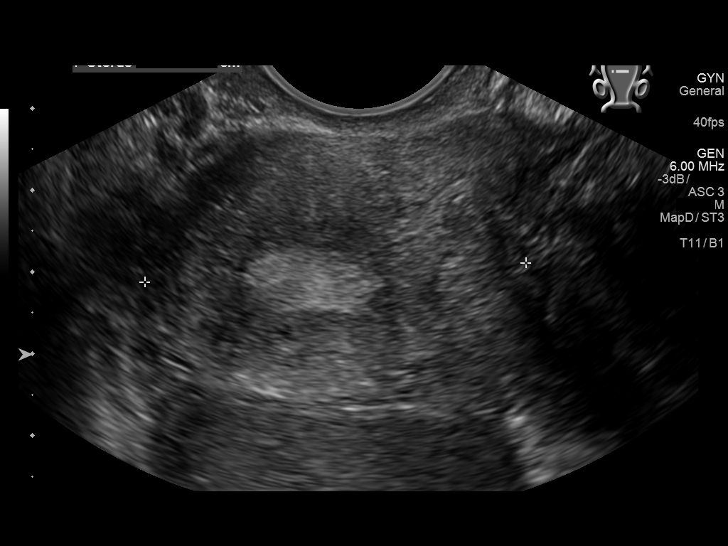
[im 83/125]
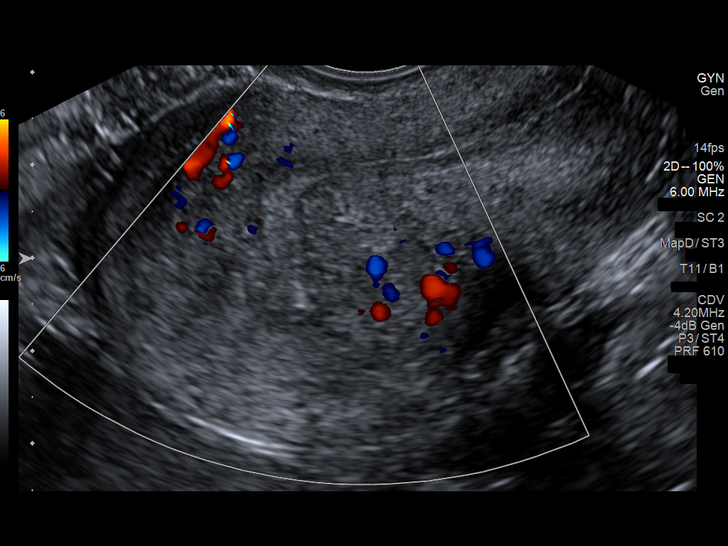
[im 94/125]
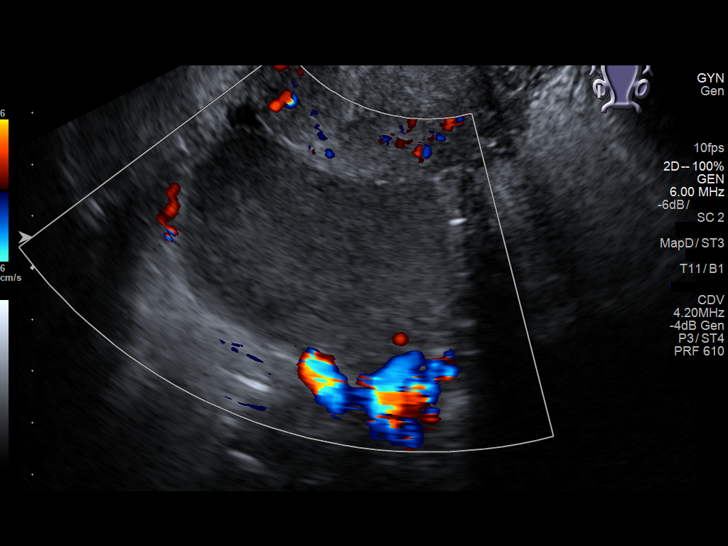
[im 104/125]
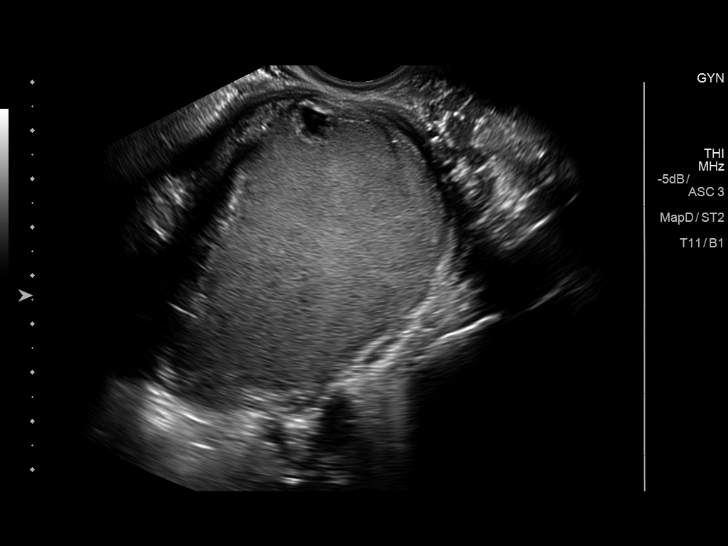
[im 114/125]
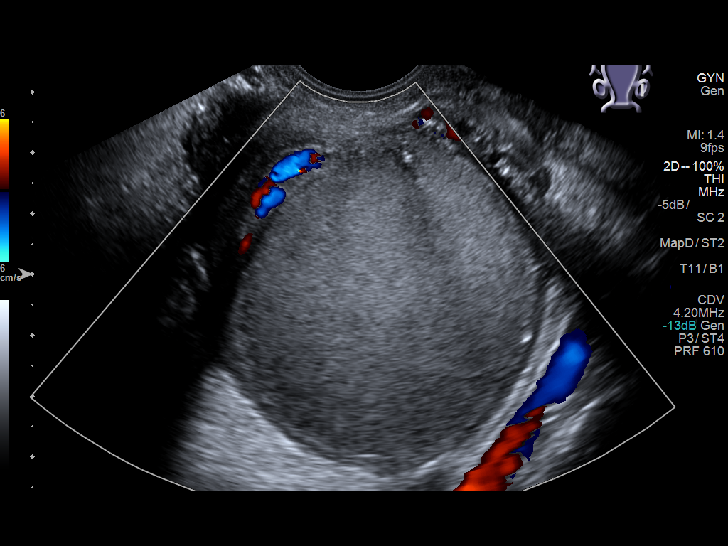
[im 125/125]
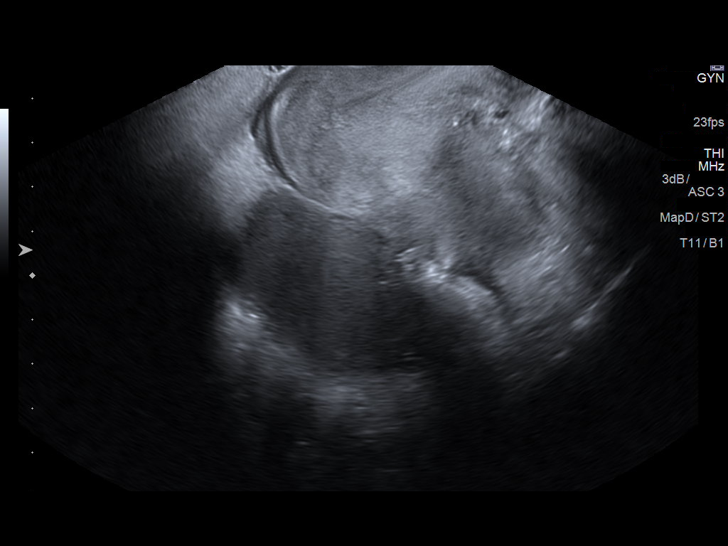

[13 of 25 positions shown; findings below may reference images not displayed]

FINDINGS: Uterus

Measurements: 6.4 x 3.5 x 4.7. Retroflexed uterus. No fibroids or
other mass visualized.

Endometrium

Thickness: 5 mm.  No focal abnormality visualized.

Right ovary

Measurements: 6.5 x 3.9 x 4.0 cm. There is a homogeneous in
echogenicity circumscribed mass with diffuse low level echoes
associated with the right adnexa measuring 6.1 x 3.6 x 3.3 cm. No
internal blood flow is seen.

Left ovary

Measurements: 5.8 x 5.6 x 5.5 cm. Similar in appearance homogeneous
in echogenicity mass seen within the left adnexa which measures
x 4.7 x 4.5 cm. Punctate echogenicities within the wall of this mass
are noted. No internal blood flow is seen.

Pulsed Doppler evaluation of both ovaries demonstrates normal
low-resistance arterial and venous waveforms.

Other findings

No abnormal free fluid.
IMPRESSION: Retroflexed, otherwise normal in appearance uterus.

Bilateral adnexal masses with sonographic appearance most suggestive
of endometriomas. Confirmation with pelvic MRI may be considered if
found clinically necessary.

Normal Doppler evaluation of bilateral ovaries, which are compressed
by the adnexal masses.
